# Patient Record
Sex: Male | Born: 1965 | Race: White | Hispanic: No | Marital: Married | Smoking: Never smoker
Health system: Southern US, Community
[De-identification: ages and names within clinical notes are randomized; demographics above are authoritative.]

## PROBLEM LIST (undated history)

## (undated) DIAGNOSIS — M545 Low back pain, unspecified: Secondary | ICD-10-CM

## (undated) DIAGNOSIS — D696 Thrombocytopenia, unspecified: Secondary | ICD-10-CM

## (undated) DIAGNOSIS — B019 Varicella without complication: Secondary | ICD-10-CM

## (undated) DIAGNOSIS — G8929 Other chronic pain: Secondary | ICD-10-CM

## (undated) DIAGNOSIS — D72819 Decreased white blood cell count, unspecified: Secondary | ICD-10-CM

## (undated) HISTORY — DX: Varicella without complication: B01.9

---
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
## 2015-10-16 ENCOUNTER — Ambulatory Visit (HOSPITAL_COMMUNITY)
Admission: EM | Admit: 2015-10-16 | Discharge: 2015-10-16 | Disposition: A | Discharge status: Home / Self Care | Payer: BLUE CROSS/BLUE SHIELD | Attending: Emergency Medicine | Admitting: Emergency Medicine

## 2015-10-16 ENCOUNTER — Encounter (HOSPITAL_COMMUNITY): Payer: Self-pay | Admitting: *Deleted

## 2015-10-16 DIAGNOSIS — S39012A Strain of muscle, fascia and tendon of lower back, initial encounter: Secondary | ICD-10-CM

## 2015-10-16 DIAGNOSIS — M545 Low back pain, unspecified: Secondary | ICD-10-CM

## 2015-10-16 HISTORY — DX: Low back pain: M54.5

## 2015-10-16 HISTORY — DX: Low back pain, unspecified: M54.50

## 2015-10-16 HISTORY — DX: Other chronic pain: G89.29

## 2015-10-16 HISTORY — DX: Thrombocytopenia, unspecified: D69.6

## 2015-10-16 HISTORY — DX: Decreased white blood cell count, unspecified: D72.819

## 2015-10-16 MED ORDER — KETOROLAC TROMETHAMINE 60 MG/2ML IM SOLN
INTRAMUSCULAR | Status: AC
  Filled 2015-10-16: qty 2

## 2015-10-16 MED ORDER — HYDROCODONE-ACETAMINOPHEN 5-325 MG PO TABS
1.0000 | ORAL_TABLET | Freq: Once | ORAL | Status: AC
  Administered 2015-10-16: 1 via ORAL

## 2015-10-16 MED ORDER — HYDROCODONE-ACETAMINOPHEN 5-325 MG PO TABS
1.0000 | ORAL_TABLET | ORAL | Status: DC | PRN
Start: 2015-10-16 — End: 2016-02-28

## 2015-10-16 MED ORDER — PREDNISONE 20 MG PO TABS: ORAL_TABLET | ORAL | Status: DC

## 2015-10-16 MED ORDER — KETOROLAC TROMETHAMINE 60 MG/2ML IM SOLN
60.0000 mg | Freq: Once | INTRAMUSCULAR | Status: AC
  Administered 2015-10-16: 60 mg via INTRAMUSCULAR

## 2015-10-16 MED ORDER — DICLOFENAC SODIUM 75 MG PO TBEC: 75.0000 mg | DELAYED_RELEASE_TABLET | Freq: Two times a day (BID) | ORAL | Status: DC

## 2015-10-16 MED ORDER — HYDROCODONE-ACETAMINOPHEN 5-325 MG PO TABS
ORAL_TABLET | ORAL | Status: AC
  Filled 2015-10-16: qty 1

## 2015-10-16 MED ORDER — METAXALONE 800 MG PO TABS: 800.0000 mg | ORAL_TABLET | Freq: Three times a day (TID) | ORAL | Status: DC

## 2015-10-16 NOTE — ED Provider Notes (Signed)
HPI  SUBJECTIVE:  Casey Guerra is a 50 y.o. male who presents with Constant, tight, stabbing right low back pain described as spasm starting this morning. Patient has a  past medical history of chronic low back pain, has not had a flare in "years". went [facility] yesterday, states that he was sore afterwards, but woke up this morning and a full blown flare. The pain does not radiate.He tried diclofenac 75 mg of Flexeril this morning. Symptoms are better with stretching and bending forward, worse with sitting and engaging his cord and with any movement. He has also tried a Restaurant manager, fast food and yoga in the past. Denies N/V, fevers, flank pain, abdominal pain, urinary urgency, frequency, dysuria, cloudy or odorous urine, hematuria.  N. No saddle anesthesia, distal weakness/numbness, bilateral radicular leg pain/weakness,  recent h/o trauma, neurological deficits,  bladder/ bowel incontinence, h/o CA / multiple myleoma, unexplained weight loss, pain worse at night,  h/o prolonged steroid use, h/o osteopenia, h/o IVDU, h/o HIV, known AAA.  States feels identical to previous episodes of back pain. no h/o pyelonephritis, nephrolithiasis.   Past Medical History  Diagnosis Date  . Chronic low back pain   . Thrombocytopenia ([place])   . Leukopenia     History reviewed. No pertinent past surgical history.  No family history on file.  Social History  Substance Use Topics  . Smoking status: Never Smoker   . Smokeless tobacco: None  . Alcohol Use: Yes     Comment: occasionally    No current facility-administered medications for this encounter.  Current outpatient prescriptions:  .  diclofenac (VOLTAREN) 75 MG EC tablet, Take 1 tablet (75 mg total) by mouth 2 (two) times daily., Disp: 40 tablet, Rfl: 0 .  HYDROcodone-acetaminophen (NORCO/VICODIN) 5-325 MG tablet, Take 1-2 tablets by mouth every 4 (four) hours as needed for moderate pain., Disp: 20 tablet, Rfl: 0 .  metaxalone (SKELAXIN) 800 MG tablet,  Take 1 tablet (800 mg total) by mouth 3 (three) times daily., Disp: 21 tablet, Rfl: 0 .  predniSONE (DELTASONE) 20 MG tablet, Take 3 tabs po on first day, 2 tabs second day, 2 tabs third day, 1 tab fourth day, 1 tab 5th day. Take with food., Disp: 9 tablet, Rfl: 0  Allergies  Allergen Reactions  . Codeine Anaphylaxis    Tolerates hydrocodone without any difficulty     ROS  As noted in HPI.   Physical Exam  BP 116/82 mmHg  Pulse 67  Temp(Src) 97.6 F (36.4 C) (Oral)  Resp 16  SpO2 99%  Constitutional: Well developed, well nourished, Moderate painful distress Eyes:  EOMI, conjunctiva normal bilaterally HENT: Normocephalic, atraumatic,mucus membranes moist Respiratory: Normal inspiratory effort Cardiovascular: Normal rate GI: nondistended. No suprapubic tenderness skin: No rash, skin intact Musculoskeletal: no CVAT. + paralumbar tenderness, +  muscle spasm. No bony tenderness. Bilateral lower extremities nontender, baseline ROM with intact PT pulses, No pain with int/ext rotation flex/extension hips bilaterally. SLR neg bilaterally. Sensation baseline light touch bilaterally for Pt, DTR's symmetric and intact bilaterally KJ , Pain aggravated with right hip flexion against resistance. Motor symmetric bilateral 5/5 hip flexion, quadriceps, hamstrings, EHL, foot dorsiflexion, foot plantarflexion, gait somewhat antalgic but without apparent new ataxia. Neurologic: Alert & oriented x 3, no focal neuro deficits Psychiatric: Speech and behavior appropriate   ED Course   Medications  ketorolac (TORADOL) injection 60 mg (60 mg Intramuscular Given 10/16/15 1907)  HYDROcodone-acetaminophen (NORCO/VICODIN) 5-325 MG per tablet 1 tablet (1 tablet Oral Given 10/16/15 1907)  Orders Placed This Encounter  Procedures  . Ambulatory referral to Physical Therapy    Referral Priority:  Urgent    Referral Type:  Physical Medicine    Referral Reason:  Specialty Services Required    Requested  Specialty:  Physical Therapy    Number of Visits Requested:  1    No results found for this or any previous visit (from the past 24 hour(s)). No results found.  ED Clinical Impression  Lumbar strain, initial encounter  Right-sided low back pain without sciatica   ED Assessment/Plan  [place] narcotic database reviewed. Pt with no narcotic rx In the past 6 months.   No evidence of uti, nephrolithiasis.  No evidence of spinal cord involvement based on H&P. Pt describing typical back pain, has been < 6 week duration. No historical red flags as noted in HPI. No physical red flags such as fever, bony tenderness, lower extremity weakness, saddle anesthesia. Imaging not indicated at this time.    Patient given Toradol and Norco 1. Home with NSAID, APAP/narcotic, muscle relaxants, steriod. Pt to f/u with PT and PMD of choice.  Discussed labs, medical decision-making, and plan for follow-up with the patient.  Discussed signs and symptoms that should prompt return to the emergency department.  Patient agrees with plan.  *This clinic note was created using Dragon dictation software. Therefore, there may be occasional mistakes despite careful proofreading.  ?    Melynda Ripple, MD 10/16/15 [phone]

## 2015-10-16 NOTE — Discharge Instructions (Signed)
Take the NSAID on a regular basis for the next 10 days. Norco for severe pain only. many people find gentle stretching and deep tissue massage helpful. Try [facility] on [address]. They have very reasonable prices and take walk ins. Or you can go to  [facility]. Follow-up with your primary care physician or a Primary care physician of your choice- see list below- in several days, go to the ER for the signs and symptoms we discussed.  [place]: [place]  [phone]  [place] and Urgent [facility]: [place]   [phone]  [facility]: [address] [place]  [phone]  [facility] : [address] [phone]  [facility]: [address] [place] [phone]  [facility]: [place] [phone]  Dr. Blanchie Serve [place]  [phone]  Dr. Benito Mccreedy, [facility]. [place], [place] [address]  [phone]

## 2015-10-16 NOTE — ED Notes (Signed)
Crackers provided.

## 2015-10-16 NOTE — ED Notes (Addendum)
Hx low back pain that has been managed well with exercise and yoga for several years; last yr suffered a shoulder injury with worker's comp, was unable to do his regular exercise of mountain biking.  Yesterday went mountain biking; woke up this AM with non-radiating severe low back pain.  Has tried stretching, cyclobenzaprine and diclofenac without any relief at all.  Pt hunched over.  Denies any parasthesias.  Pt recently had routine blood work done for physical last week - was told he had thrombocytopenia and leukopenia; had repeat CBC done 5 days ago - has not heard results yet.

## 2015-10-17 DIAGNOSIS — C4492 Squamous cell carcinoma of skin, unspecified: Secondary | ICD-10-CM

## 2015-10-17 HISTORY — DX: Squamous cell carcinoma of skin, unspecified: C44.92

## 2016-02-28 ENCOUNTER — Encounter: Payer: Self-pay | Admitting: Family Medicine

## 2016-02-28 ENCOUNTER — Ambulatory Visit (INDEPENDENT_AMBULATORY_CARE_PROVIDER_SITE_OTHER): Payer: BLUE CROSS/BLUE SHIELD | Admitting: Family Medicine

## 2016-02-28 ENCOUNTER — Encounter: Payer: Self-pay | Admitting: Sports Medicine

## 2016-02-28 DIAGNOSIS — M25561 Pain in right knee: Secondary | ICD-10-CM | POA: Diagnosis not present

## 2016-02-28 MED ORDER — NITROGLYCERIN 0.2 MG/HR TD PT24: MEDICATED_PATCH | TRANSDERMAL | 1 refills | Status: DC

## 2016-02-28 NOTE — Patient Instructions (Signed)
Nitroglycerin Protocol   Apply 1/4 nitroglycerin patch to affected area daily.  Change position of patch within the affected area every 24 hours.  You may experience a headache during the first 1-2 weeks of using the patch, these should subside.  If you experience headaches after beginning nitroglycerin patch treatment, you may take your preferred over the counter pain reliever.  Another side effect of the nitroglycerin patch is skin irritation or rash related to patch adhesive.  Please notify our office if you develop more severe headaches or rash, and stop the patch.  Tendon healing with nitroglycerin patch may require 12 to 24 weeks depending on the extent of injury.  Men should not use if taking Viagra, Cialis, or Levitra.   Do not use if you have migraines or rosacea.    FOLLOW UP IN 4 WEEKS

## 2016-02-28 NOTE — Progress Notes (Signed)
  [name] - 50 y.o. male MRN [id number]  Date of birth: 1965/08/18  SUBJECTIVE:  Including CC & ROS.  Chief Complaint  Patient presents with  . Knee Pain     Casey Guerra is a 50 yo M that is presenting with right knee pain. This pain is acute on chronic and started at the beginning of this past year. 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 He has been getting over back and shoulder problems recently. The pain is occurring on this. Pole of his patella. He has pain with the downward dog and yoga. Describes pain as a dull ache and seems be getting worse. The pain is localized. He denies any locking, swelling or giving way. He denies any prior injury to his knee.  ROS: No unexpected weight loss, fever, chills, swelling, instability, muscle pain, numbness/tingling, redness, otherwise see HPI    HISTORY: Past Medical, Surgical, Social, and Family History Reviewed & Updated per EMR.   Pertinent Historical Findings include: PMSHx - back pain   PSHx -  No tobacco use. Occasional alcohol use. [place]  FHx -  OA Medications - voltaren   DATA REVIEWED: None to review   PHYSICAL EXAM:  VS: BP:126/69  HR:60bpm  TEMP: ( )  RESP:   HT:5\' 9"  (175.3 cm)   WT:172 lb (78 kg)  BMI:25.5 PHYSICAL EXAM: Gen: NAD, alert, cooperative with exam, well-appearing HEENT: clear conjunctiva, EOMI CV:  no edema, capillary refill brisk,  Resp: non-labored, normal speech Skin: no rashes, normal turgor  Neuro: no gross deficits.  Psych:  alert and oriented Right Knee: Normal to inspection with no erythema or effusion or obvious bony abnormalities. Some pain to palpation on the superior pole of the patella. ROM full in flexion and extension and lower leg rotation. Normal strength in the lower extremity. Ligaments with solid consistent endpoints including ACL, LCL, MCL. Negative Mcmurray's Hamstring and quadriceps strength is normal.  Neurovascularly  intact  Limited US: Right Knee: No effusion noted in the suprapatellar pouch. Quadricep tendon was normal appearance. There is spurring upon the insertion of the quadriceps tendon into the patella. Normal patellar tendon. Normal medial and lateral meniscus.  ASSESSMENT & PLAN:   Right knee pain Pain is most likely related to the spurring observed on [place] on the insertion of the quad tendon. No effusion to suggest intra-articular pathology.  - try nitro patch  - provided home exercises  - will follow up in 4 weeks to re-scan and monitor for improvement. May need to consider PT if no improvement.

## 2016-02-29 DIAGNOSIS — M25561 Pain in right knee: Secondary | ICD-10-CM | POA: Insufficient documentation

## 2016-02-29 NOTE — Assessment & Plan Note (Signed)
Pain is most likely related to the spurring observed on [place] on the insertion of the quad tendon. No effusion to suggest intra-articular pathology.  - try nitro patch  - provided home exercises  - will follow up in 4 weeks to re-scan and monitor for improvement. May need to consider PT if no improvement.

## 2016-03-27 ENCOUNTER — Encounter: Payer: Self-pay | Admitting: Sports Medicine

## 2016-03-27 ENCOUNTER — Encounter: Payer: Self-pay | Admitting: Family Medicine

## 2016-03-27 ENCOUNTER — Ambulatory Visit (INDEPENDENT_AMBULATORY_CARE_PROVIDER_SITE_OTHER): Payer: BLUE CROSS/BLUE SHIELD | Admitting: Family Medicine

## 2016-03-27 DIAGNOSIS — M25561 Pain in right knee: Secondary | ICD-10-CM

## 2016-03-27 NOTE — Assessment & Plan Note (Signed)
He was not able to tolerate the nitroglycerin patches but he said an improvement of his pain with the use of different oils. - Advised to continue using oils on a regular basis - Advised to follow-up as needed. - May try compression sleeve if his pain re-occurs

## 2016-03-27 NOTE — Progress Notes (Signed)
  Casey Guerra - 50 y.o. male MRN [id number]  Date of birth: April 07, 1966  SUBJECTIVE:  Including CC & ROS.  Chief Complaint  Patient presents with  . Knee Pain   Casey Guerra is a 50 year old mellitus following up for his right knee pain. He was started on nitroglycerin patches but was not able to tolerate them. He developed a severe headache and subsequent emesis. He started using Cypress oil and coconut oil about 4-5 times daily. He reports a 70% reduction of his pain. He hasn't been riding his bike yet but has been walking. He is able walk 4 miles the other day with no pain.   ROS: No unexpected weight loss, fever, chills, swelling, instability, muscle pain, numbness/tingling, redness, otherwise see HPI   HISTORY: Past Medical, Surgical, Social, and Family History Reviewed & Updated per EMR.   Pertinent Historical Findings include: PMSHx -  Back pain   DATA REVIEWED: Previous ultrasound scans.   PHYSICAL EXAM:  VS: BP:109/78  HR: bpm  TEMP: ( )  RESP:   HT:5\' 9"  (175.3 cm)   WT:172 lb (78 kg)  BMI:25.5 PHYSICAL EXAM: Gen: NAD, alert, cooperative with exam, well-appearing HEENT: clear conjunctiva, EOMI CV:  no edema, capillary refill brisk,  Resp: non-labored, normal speech Skin: no rashes, normal turgor  Neuro: no gross deficits.  Psych:  alert and oriented Knee:  No tenderness to palpation of the quarter patellar tendon. No obvious effusion. No tenderness to palpation of the medial lateral joint line. Normal range of motion. 5 out of 5 strength.  Limited ultrasound: Right knee: No effusion in the suprapatellar pouch. Quadriceps tendon was reviewed along the short axis and normal. The insertion into the patella showed continued spurs that was noticed on the last exam.  ASSESSMENT & PLAN:   Right knee pain He was not able to tolerate the nitroglycerin patches but he said an improvement of his pain with the use of different oils. - Advised to continue using oils on a  regular basis - Advised to follow-up as needed. - May try compression sleeve if his pain re-occurs

## 2017-02-27 ENCOUNTER — Encounter: Payer: Self-pay | Admitting: Family Medicine

## 2017-02-27 ENCOUNTER — Ambulatory Visit (INDEPENDENT_AMBULATORY_CARE_PROVIDER_SITE_OTHER): Payer: BLUE CROSS/BLUE SHIELD | Admitting: Family Medicine

## 2017-02-27 VITALS — BP 120/70 | HR 72 | Ht 69.0 in | Wt 164.2 lb

## 2017-02-27 DIAGNOSIS — M778 Other enthesopathies, not elsewhere classified: Secondary | ICD-10-CM | POA: Diagnosis not present

## 2017-02-27 DIAGNOSIS — Z0000 Encounter for general adult medical examination without abnormal findings: Secondary | ICD-10-CM | POA: Diagnosis not present

## 2017-02-27 NOTE — Progress Notes (Signed)
Casey Guerra is a 51 y.o. male is here to [facility].   Patient Care Team: Briscoe Deutscher, DO as PCP - General (Family Medicine)   History of Present Illness:   Casey Guerra, cma is acting as a Education administrator for [employer], DO.  HPI: Patient is here to establish care and for a physical. He does complain of some mild right elbow pain that started a few months ago when he starting playing disc golf. It only hurts in the morning and not impairing function.  Health Maintenance Due  Topic Date Due  . HIV Screening  03/15/1983  . TETANUS/TDAP  03/15/1987   Depression screen PHQ 2/9 02/27/2017  Decreased Interest 0  Down, Depressed, Hopeless 0  PHQ - 2 Score 0   PMHx, SurgHx, SocialHx, Medications, and Allergies were reviewed in the Visit Navigator and updated as appropriate.   Past Medical History:  Diagnosis Date  . Chicken pox   . Chronic low back pain   . Leukopenia   . Thrombocytopenia ([place])     Past Surgical History:  Procedure Laterality Date  . Wart Removed From Tongue  1974    Family History  Problem Relation Age of Onset  . Arthritis Mother   . Cancer Mother   . Depression Mother   . Hearing loss Mother   . Hypertension Mother   . Alcohol abuse Father   . Cancer Father   . Depression Brother   . Drug abuse Brother   . Learning disabilities Son    Social History  Substance Use Topics  . Smoking status: Never Smoker  . Smokeless tobacco: Never Used  . Alcohol use Yes     Comment: occasionally   Current Medications and Allergies:   No current outpatient prescriptions on file.  Allergies  Allergen Reactions  . Codeine Anaphylaxis    Tolerates hydrocodone without any difficulty   Review of Systems:   Pertinent items are noted in the HPI. Otherwise, ROS is negative.  Vitals:   Vitals:   02/27/17 1520  BP: 120/70  Pulse: 72  SpO2: 97%  Weight: 164 lb 3.2 oz (74.5 kg)  Height: 5' 9"  (1.753 m)     Body mass index is 24.25 kg/m. Physical  Exam:   Physical Exam  Constitutional: He is oriented to person, place, and time. He appears well-developed and well-nourished. No distress.  HENT:  Head: Normocephalic and atraumatic.  Right Ear: External ear normal.  Left Ear: External ear normal.  Nose: Nose normal.  Mouth/Throat: Oropharynx is clear and moist.  Eyes: Pupils are equal, round, and reactive to light. Conjunctivae and EOM are normal.  Neck: Normal range of motion. Neck supple.  Cardiovascular: Normal rate, regular rhythm, normal heart sounds and intact distal pulses.   Pulmonary/Chest: Effort normal and breath sounds normal.  Abdominal: Soft. Bowel sounds are normal.  Musculoskeletal: Normal range of motion.  Neurological: He is alert and oriented to person, place, and time.  Skin: Skin is warm and dry.  Psychiatric: He has a normal mood and affect. His behavior is normal. Judgment and thought content normal.  Nursing note and vitals reviewed.  Results for orders placed or performed in visit on 02/27/17  Comp Met (CMET)  Result Value Ref Range   Sodium 139 135 - 145 mEq/L   Potassium 3.9 3.5 - 5.1 mEq/L   Chloride 103 96 - 112 mEq/L   CO2 28 19 - 32 mEq/L   Glucose, Bld 95 70 - 99 mg/dL  BUN 27 (H) 6 - 23 mg/dL   Creatinine, Ser 0.97 0.40 - 1.50 mg/dL   Total Bilirubin 0.3 0.2 - 1.2 mg/dL   Alkaline Phosphatase 51 39 - 117 U/L   AST 19 0 - 37 U/L   ALT 15 0 - 53 U/L   Total Protein 6.5 6.0 - 8.3 g/dL   Albumin 4.4 3.5 - 5.2 g/dL   Calcium 9.3 8.4 - 10.5 mg/dL   GFR 87.45 >60.00 mL/min  Lipid panel  Result Value Ref Range   Cholesterol 181 0 - 200 mg/dL   Triglycerides 175.0 (H) 0.0 - 149.0 mg/dL   HDL 60.60 >39.00 mg/dL   VLDL 35.0 0.0 - 40.0 mg/dL   LDL Cholesterol 85 0 - 99 mg/dL   Total CHOL/HDL Ratio 3    NonHDL 119.97    Assessment and Plan:   Casey Guerra was seen today for establish care and right elbow pain.  Diagnoses and all orders for this visit:  Routine physical examination -     Comp Met  (CMET) -     Lipid panel  Right elbow tendonitis Comments: Mild. PRICE discussed. If worse, to [place].   . Reviewed expectations re: course of current medical issues. . Discussed self-management of symptoms. . Outlined signs and symptoms indicating need for more acute intervention. . Patient verbalized understanding and all questions were answered. Marland Kitchen Health Maintenance issues including appropriate healthy diet, exercise, and smoking avoidance were discussed with patient. . See orders for this visit as documented in the electronic medical record. . Patient received an After Visit Summary.  CMA served as Education administrator during this visit. History, Physical, and Plan performed by medical provider. The above documentation has been reviewed and is accurate and complete. Briscoe Deutscher, D.O.  Briscoe Deutscher, DO [place] [place] [facility] 03/09/2017  No future appointments.

## 2017-02-28 LAB — COMPREHENSIVE METABOLIC PANEL
ALT: 15 U/L (ref 0–53)
AST: 19 U/L (ref 0–37)
Albumin: 4.4 g/dL (ref 3.5–5.2)
Alkaline Phosphatase: 51 U/L (ref 39–117)
BUN: 27 mg/dL — ABNORMAL HIGH (ref 6–23)
CO2: 28 mEq/L (ref 19–32)
Calcium: 9.3 mg/dL (ref 8.4–10.5)
Chloride: 103 mEq/L (ref 96–112)
Creatinine, Ser: 0.97 mg/dL (ref 0.40–1.50)
GFR: 87.45 mL/min (ref >60.00)
Glucose, Bld: 95 mg/dL (ref 70–99)
Potassium: 3.9 mEq/L (ref 3.5–5.1)
Sodium: 139 mEq/L (ref 135–145)
Total Bilirubin: 0.3 mg/dL (ref 0.2–1.2)
Total Protein: 6.5 g/dL (ref 6.0–8.3)

## 2017-02-28 LAB — LIPID PANEL
Cholesterol: 181 mg/dL (ref 0–200)
HDL: 60.6 mg/dL (ref >39.00)
LDL Cholesterol: 85 mg/dL (ref 0–99)
NonHDL: 119.97
Total CHOL/HDL Ratio: 3
Triglycerides: 175 mg/dL — ABNORMAL HIGH (ref 0.0–149.0)
VLDL: 35 mg/dL (ref 0.0–40.0)

## 2017-03-04 ENCOUNTER — Telehealth: Payer: Self-pay | Admitting: Family Medicine

## 2017-03-04 NOTE — Telephone Encounter (Signed)
ROI faxed to Dr. Sheryn Bison

## 2017-07-05 ENCOUNTER — Ambulatory Visit: Payer: BLUE CROSS/BLUE SHIELD | Admitting: Family Medicine

## 2017-07-05 ENCOUNTER — Encounter: Payer: Self-pay | Admitting: Family Medicine

## 2017-07-05 ENCOUNTER — Encounter: Payer: Self-pay | Admitting: Surgical

## 2017-07-05 VITALS — BP 112/68 | HR 67 | Temp 98.1°F | Ht 69.0 in | Wt 167.0 lb

## 2017-07-05 DIAGNOSIS — H9313 Tinnitus, bilateral: Secondary | ICD-10-CM

## 2017-07-05 LAB — COMPREHENSIVE METABOLIC PANEL
ALT: 15 U/L (ref 0–53)
AST: 20 U/L (ref 0–37)
Albumin: 4.3 g/dL (ref 3.5–5.2)
Alkaline Phosphatase: 48 U/L (ref 39–117)
BUN: 11 mg/dL (ref 6–23)
CO2: 29 mEq/L (ref 19–32)
Calcium: 9.1 mg/dL (ref 8.4–10.5)
Chloride: 106 mEq/L (ref 96–112)
Creatinine, Ser: 0.96 mg/dL (ref 0.40–1.50)
GFR: 88.37 mL/min (ref >60.00)
Glucose, Bld: 105 mg/dL — ABNORMAL HIGH (ref 70–99)
Potassium: 4.3 mEq/L (ref 3.5–5.1)
Sodium: 141 mEq/L (ref 135–145)
Total Bilirubin: 0.4 mg/dL (ref 0.2–1.2)
Total Protein: 6.9 g/dL (ref 6.0–8.3)

## 2017-07-05 LAB — CBC WITH DIFFERENTIAL/PLATELET
Basophils Absolute: 0 10*3/uL (ref 0.0–0.1)
Basophils Relative: 0.4 % (ref 0.0–3.0)
Eosinophils Absolute: 0.1 10*3/uL (ref 0.0–0.7)
Eosinophils Relative: 2.8 % (ref 0.0–5.0)
HCT: 43.3 % (ref 39.0–52.0)
Hemoglobin: 14.4 g/dL (ref 13.0–17.0)
Lymphocytes Relative: 32.8 % (ref 12.0–46.0)
Lymphs Abs: 1.2 10*3/uL (ref 0.7–4.0)
MCHC: 33.3 g/dL (ref 30.0–36.0)
MCV: 93.4 fl (ref 78.0–100.0)
Monocytes Absolute: 0.4 10*3/uL (ref 0.1–1.0)
Monocytes Relative: 11.2 % (ref 3.0–12.0)
Neutro Abs: 2 10*3/uL (ref 1.4–7.7)
Neutrophils Relative %: 52.8 % (ref 43.0–77.0)
Platelets: 148 10*3/uL — ABNORMAL LOW (ref 150.0–400.0)
RBC: 4.63 Mil/uL (ref 4.22–5.81)
RDW: 13 % (ref 11.5–15.5)
WBC: 3.7 10*3/uL — ABNORMAL LOW (ref 4.0–10.5)

## 2017-07-05 LAB — TSH: TSH: 2.42 u[IU]/mL (ref 0.35–4.50)

## 2017-07-05 NOTE — Progress Notes (Signed)
Casey Guerra is a 52 y.o. male is here for follow up.  History of Present Illness:   HPI: See Assessment and Plan section for Problem Based Charting of issues discussed today.  Health Maintenance Due  Topic Date Due  . HIV Screening  03/15/1983  . TETANUS/TDAP  03/15/1987   Depression screen PHQ 2/9 02/27/2017 03/27/2016 02/28/2016  Decreased Interest 0 0 0  Down, Depressed, Hopeless 0 0 0  PHQ - 2 Score 0 0 0   PMHx, SurgHx, SocialHx, FamHx, Medications, and Allergies were reviewed in the Visit Navigator and updated as appropriate.   Patient Active Problem List   Diagnosis Date Noted  . Right knee pain 02/29/2016   Social History   Tobacco Use  . Smoking status: Never Smoker  . Smokeless tobacco: Never Used  Substance Use Topics  . Alcohol use: Yes    Comment: occasionally  . Drug use: Yes    Types: Other-see comments    Comment: Marijuana   Current Medications and Allergies:  No current outpatient medications on file.   Allergies  Allergen Reactions  . Codeine Anaphylaxis    Tolerates hydrocodone without any difficulty   Review of Systems   Pertinent items are noted in the HPI. Otherwise, ROS is negative.  Vitals:   Vitals:   07/05/17 0727  BP: 112/68  Pulse: 67  Temp: 98.1 F (36.7 C)  TempSrc: Oral  SpO2: 96%  Weight: 167 lb (75.8 kg)  Height: 5\' 9"  (1.753 m)     Body mass index is 24.66 kg/m.   Physical Exam:   Physical Exam  Constitutional: He is oriented to person, place, and time. He appears well-developed and well-nourished. No distress.  HENT:  Head: Normocephalic and atraumatic.  Right Ear: External ear normal.  Left Ear: External ear normal.  Nose: Nose normal.  Mouth/Throat: Oropharynx is clear and moist.  Eyes: Conjunctivae and EOM are normal. Pupils are equal, round, and reactive to light.  Neck: Normal range of motion. Neck supple.  Cardiovascular: Normal rate, regular rhythm, normal heart sounds and intact distal pulses.    Pulmonary/Chest: Effort normal and breath sounds normal.  Abdominal: Soft. Bowel sounds are normal.  Musculoskeletal: Normal range of motion.  Neurological: He is alert and oriented to person, place, and time.  Skin: Skin is warm and dry.  Psychiatric: He has a normal mood and affect. His behavior is normal. Judgment and thought content normal.  Nursing note and vitals reviewed.   Assessment and Plan:   Diagnoses and all orders for this visit:  Tinnitus of both ears Comments: Patient presents with tinnitus. Onset of symptoms was abrupt starting 3 weeks ago ago with unchanged course since that time. Patient describes the tinnitus as constant located in the bilateral ear. The quality is described as high pitch that sounds like seashells. The pattern is nonpulsatile with an intensity that is medium. Patient describes his level of annoyance as minimally annoying, intermittently aware. Associated symptoms include: no hearing loss, pain, dizziness, drainage or recurrent otitis. Family history is negative family history for tinnitus Patient has had no prior evaluation, treatment or surgery for tinnitus Patient does not have hearing aids at this time. Previous treatments include none.  Hearing screening today: WNL. ENT referral in place.   Orders: -     Comprehensive metabolic panel -     CBC with Differential/Platelet -     TSH  . Reviewed expectations re: course of current medical issues. . Discussed self-management of symptoms. Marland Kitchen  Outlined signs and symptoms indicating need for more acute intervention. . Patient verbalized understanding and all questions were answered. Marland Kitchen Health Maintenance issues including appropriate healthy diet, exercise, and smoking avoidance were discussed with patient. . See orders for this visit as documented in the electronic medical record. . Patient received an After Visit Summary.  Briscoe Deutscher, DO [place] 07/05/2017  No future  appointments.

## 2017-07-23 ENCOUNTER — Telehealth: Payer: Self-pay

## 2017-07-23 DIAGNOSIS — R5383 Other fatigue: Secondary | ICD-10-CM

## 2017-07-23 NOTE — Telephone Encounter (Signed)
Pt coming for repeat labs 07/25/17. Please place future orders. Thank you.

## 2017-07-23 NOTE — Telephone Encounter (Signed)
Labs ordered.

## 2017-07-25 ENCOUNTER — Encounter: Payer: Self-pay | Admitting: *Deleted

## 2017-07-25 ENCOUNTER — Other Ambulatory Visit (INDEPENDENT_AMBULATORY_CARE_PROVIDER_SITE_OTHER): Payer: BLUE CROSS/BLUE SHIELD

## 2017-07-25 DIAGNOSIS — R5383 Other fatigue: Secondary | ICD-10-CM

## 2017-07-26 ENCOUNTER — Other Ambulatory Visit: Payer: BLUE CROSS/BLUE SHIELD

## 2017-07-26 DIAGNOSIS — R5383 Other fatigue: Secondary | ICD-10-CM

## 2017-07-26 LAB — CBC WITH DIFFERENTIAL/PLATELET
Basophils Absolute: 9 cells/uL (ref 0–200)
Basophils Relative: 0.2 %
Eosinophils Absolute: 81 cells/uL (ref 15–500)
Eosinophils Relative: 1.8 %
HCT: 37.9 % — ABNORMAL LOW (ref 38.5–50.0)
Hemoglobin: 13.1 g/dL — ABNORMAL LOW (ref 13.2–17.1)
Lymphs Abs: 1224 cells/uL (ref 850–3900)
MCH: 31 pg (ref 27.0–33.0)
MCHC: 34.6 g/dL (ref 32.0–36.0)
MCV: 89.8 fL (ref 80.0–100.0)
MPV: 12.4 fL (ref 7.5–12.5)
Monocytes Relative: 10 %
Neutro Abs: 2736 cells/uL (ref 1500–7800)
Neutrophils Relative %: 60.8 %
Platelets: 137 10*3/uL — ABNORMAL LOW (ref 140–400)
RBC: 4.22 10*6/uL (ref 4.20–5.80)
RDW: 12.6 % (ref 11.0–15.0)
Total Lymphocyte: 27.2 %
WBC mixed population: 450 cells/uL (ref 200–950)
WBC: 4.5 10*3/uL (ref 3.8–10.8)

## 2017-12-10 ENCOUNTER — Encounter: Payer: Self-pay | Admitting: *Deleted

## 2017-12-10 ENCOUNTER — Encounter: Payer: Self-pay | Admitting: Family Medicine

## 2017-12-10 ENCOUNTER — Ambulatory Visit: Payer: BLUE CROSS/BLUE SHIELD | Admitting: Family Medicine

## 2017-12-10 ENCOUNTER — Ambulatory Visit (INDEPENDENT_AMBULATORY_CARE_PROVIDER_SITE_OTHER): Payer: BLUE CROSS/BLUE SHIELD

## 2017-12-10 VITALS — BP 120/62 | HR 60 | Temp 98.6°F | Ht 64.0 in | Wt 178.6 lb

## 2017-12-10 DIAGNOSIS — E785 Hyperlipidemia, unspecified: Secondary | ICD-10-CM | POA: Insufficient documentation

## 2017-12-10 DIAGNOSIS — Z23 Encounter for immunization: Secondary | ICD-10-CM

## 2017-12-10 DIAGNOSIS — M25562 Pain in left knee: Secondary | ICD-10-CM
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
## 2017-12-13 ENCOUNTER — Ambulatory Visit: Payer: Self-pay

## 2017-12-13 ENCOUNTER — Ambulatory Visit: Payer: BLUE CROSS/BLUE SHIELD | Admitting: Sports Medicine

## 2017-12-13 ENCOUNTER — Encounter: Payer: Self-pay | Admitting: Sports Medicine

## 2017-12-13 VITALS — BP 102/72 | HR 70 | Ht 69.0 in | Wt 179.0 lb

## 2017-12-13 DIAGNOSIS — M25562 Pain in left knee: Secondary | ICD-10-CM | POA: Diagnosis not present

## 2017-12-13 DIAGNOSIS — G8929 Other chronic pain: Secondary | ICD-10-CM | POA: Diagnosis not present
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
## 2017-12-13 NOTE — Progress Notes (Signed)
PROCEDURE NOTE:  Ultrasound Guided: Injection: Left knee Images were obtained and interpreted by myself, Teresa Coombs, DO  Images have been saved and stored to PACS system. Images obtained on: GE S7 Ultrasound machine    ULTRASOUND FINDINGS:  Left knee has a moderate degree of synovitis. There is a small effusion which is supraphysiologic. Small amount of swelling just deep to the IT band.   DESCRIPTION OF PROCEDURE:  The patient's clinical condition is marked by substantial pain and/or significant functional disability. Other conservative therapy has not provided relief, is contraindicated, or not appropriate. There is a reasonable likelihood that injection will significantly improve the patient's pain and/or functional impairment.   After discussing the risks, benefits and expected outcomes of the injection and all questions were reviewed and answered, the patient wished to undergo the above named procedure.  Verbal consent was obtained.  The ultrasound was used to identify the target structure and adjacent neurovascular structures. The skin was then prepped in sterile fashion and the target structure was injected under direct visualization using sterile technique as below:  Single injection performed as below: PREP: Alcohol and Ethel Chloride APPROACH:superiolateral, single injection, 25g 1.5 in. INJECTATE: 2 cc 0.5% Marcaine and 2 cc 40mg /mL DepoMedrol ASPIRATE: None DRESSING: Band-Aid  Post procedural instructions including recommending icing and warning signs for infection were reviewed.    This procedure was well tolerated and there were no complications.   IMPRESSION: Succesful Ultrasound Guided: Injection

## 2017-12-13 NOTE — Patient Instructions (Addendum)

## 2018-01-07 ENCOUNTER — Encounter: Payer: Self-pay | Admitting: Family Medicine

## 2018-01-07 ENCOUNTER — Ambulatory Visit: Payer: BLUE CROSS/BLUE SHIELD | Admitting: Family Medicine

## 2018-01-07 VITALS — BP 126/82 | HR 71 | Temp 97.9°F | Ht 69.0 in | Wt 181.8 lb

## 2018-01-07 DIAGNOSIS — M79671 Pain in right foot: Secondary | ICD-10-CM

## 2018-01-07 MED ORDER — DICLOFENAC SODIUM 75 MG PO TBEC: 75.0000 mg | DELAYED_RELEASE_TABLET | Freq: Two times a day (BID) | ORAL | 0 refills | Status: AC

## 2018-01-07 NOTE — Patient Instructions (Signed)
It was very nice to see you today!  I think you have irritated your plantar fascia.  Please take the anti-inflammatories we discussed.  Please work on exercises.  Come back to see Dr. Paulla Fore if your symptoms worsen or do not improve the next 2 weeks.  Take care, Dr Jerline Pain

## 2018-01-07 NOTE — Progress Notes (Signed)
   Subjective:  Casey Guerra is a 52 y.o. male who presents today for same-day appointment with a chief complaint of foot pain.   HPI:  Foot Pain, Acute problem Started this morning.  Patient was walking when he no severe pain to the bottom of his right foot.  Denies any falls, trauma, or other obvious precipitating events.  Pain was so severe that he had to stop walking.  Pain subsided after rest.  Does not feel any pain when he is at rest.  Pain worsens when he begins to walk.  Pain will radiate to the top of the foot.  He has had episodes of plantar fasciitis in the past and pain feels similar.  No specific treatments tried.  No other obvious alleviating or aggravating factors.  ROS: Per HPI  PMH: He reports that he has never smoked. He has never used smokeless tobacco. He reports that he drinks alcohol. He reports that he has current or past drug history. Drug: Other-see comments.  Objective:  Physical Exam: BP 126/82 (BP Location: Right Arm, Patient Position: Sitting, Cuff Size: Normal)   Pulse 71   Temp 97.9 F (36.6 C) (Oral)   Ht 5\' 9"  (1.753 m)   Wt 181 lb 12.8 oz (82.5 kg)   SpO2 97%   BMI 26.85 kg/m   Gen: NAD, resting comfortably MSK: -Right foot: No deformities.  Tender palpation along plantar arch.  No midfoot tenderness.  Neurovascular intact distally.  Assessment/Plan:  Right foot pain Exam most consistent with plantar fasciitis.  Other possibilities include midfoot gout (less likely given lack of inflamed joint on exam) and fracture (less likely given absence of trauma).  He has severe pain with walking.  Given his severe degree of pain, will place patient and rigid sole shoe for the next several days.  We will also start a course of oral anti-inflammatories.  Discussed home exercise program and conservative measures.  Discussed reasons to return to care.  Follow-up as needed.  Consider referral to sports medicine if symptoms do not improve over the next couple  weeks.  Algis Greenhouse. Jerline Pain, MD 01/07/2018 3:07 PM

## 2018-01-27 ENCOUNTER — Ambulatory Visit: Payer: BLUE CROSS/BLUE SHIELD | Admitting: Sports Medicine

## 2018-01-27 ENCOUNTER — Encounter: Payer: Self-pay | Admitting: Sports Medicine

## 2018-01-27 VITALS — BP 110/70 | HR 72 | Ht 69.0 in | Wt 179.6 lb

## 2018-01-27 DIAGNOSIS — M25562 Pain in left knee: Secondary | ICD-10-CM | POA: Diagnosis not present

## 2018-01-27 DIAGNOSIS — G8929 Other chronic pain: Secondary | ICD-10-CM

## 2018-01-27 DIAGNOSIS — M25362 Other instability, left knee: Secondary | ICD-10-CM

## 2018-01-27 MED ORDER — DICLOFENAC SODIUM 2 % TD SOLN: 1.0000 "application " | Freq: Two times a day (BID) | TRANSDERMAL | 0 refills | Status: AC

## 2018-01-27 MED ORDER — DICLOFENAC SODIUM 2 % TD SOLN: 1.0000 "application " | Freq: Two times a day (BID) | TRANSDERMAL | 2 refills | Status: AC

## 2018-01-27 NOTE — Patient Instructions (Addendum)
We are ordering an MRI for you today.  The imaging office will be calling you to schedule your appointment after we obtain authorization from your insurance company.   Please be sure you have signed up for MyChart so that we can get your results to you.  We will be in touch with you as soon as we can.  Please know, it can take up to 3-4 business days for the radiologist and Dr. Paulla Fore to have time to review the results and determine the best appropriate action.  If there is something that appears to be surgical or needs a referral to other specialists we will let you know through [place] or telephone.  Otherwise we will plan to schedule a follow up appointment with Dr. Paulla Fore once we have the results.    [place] instructions for Duexis, Pennsaid and Vimovo:  Your prescription will be filled through a mail order pharmacy.  It is typically [place] but may vary depending on where you live.  You will receive a phone call from them which will typically come from a [phone] phone number.  You must speak directly to them to have this medication filled.  When the pharmacy calls, they will need your mailing address (for overnight shipment of the medication) andy they will need payment information if you have a copay (typically no more than $10). If you have not heard from them 2-3 days after your appointment with Dr. Paulla Fore, contact us at the office [phone]) or through [place] so we can reach back out to the pharmacy.   Pennsaid instructions: You have been given a sample/prescription for Pennsaid, a topical medication.     You are to apply this gel to your injured body part twice daily (morning and evening).   A little goes a long way so you can use about a pea-sized amount for each area.   Spread this small amount over the area into a thin film and let it dry.   Be sure that you do not rub the gel into your skin for more than 10 or 15 seconds otherwise it can irritate you skin.    Once  you apply the gel, please do not put any other lotion or clothing in contact with that area for 30 minutes to allow the gel to absorb into your skin.   Some people are sensitive to the medication and can develop a sunburn-like rash.  If you have only mild symptoms it is okay to continue to use the medication but if you have any breakdown of your skin you should discontinue its use and please let us know.   If you have been written a prescription for Pennsaid, you will receive a pump bottle of this topical gel through a mail order pharmacy.  The instructions on the bottle will say to apply two pumps twice a day which may be too much gel for your particular area so use the pea-sized amount as your guide.
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
## 2018-02-02 ENCOUNTER — Other Ambulatory Visit: Payer: BLUE CROSS/BLUE SHIELD

## 2018-02-06 ENCOUNTER — Ambulatory Visit
Admission: RE | Admit: 2018-02-06 | Discharge: 2018-02-06 | Disposition: A | Discharge status: Home / Self Care | Payer: BLUE CROSS/BLUE SHIELD | Source: Ambulatory Visit | Attending: Sports Medicine | Admitting: Sports Medicine

## 2018-02-06 DIAGNOSIS — G8929 Other chronic pain: Secondary | ICD-10-CM

## 2018-02-06 DIAGNOSIS — M25562 Pain in left knee: Principal | ICD-10-CM

## 2018-02-07 NOTE — Progress Notes (Signed)
My chart message as below: Please ensure follow-up as scheduled within next 2 to 3 days.  It does look like your PCL is partially torn as we suspected.  Does look like you have a small amount of irritation of the cartilage as well which is associated with the injury.  I would like to schedule a follow-up to go over these results with you in person does not appear to be surgical at this time.

## 2018-02-10 ENCOUNTER — Encounter: Payer: Self-pay | Admitting: Sports Medicine

## 2018-02-10 ENCOUNTER — Ambulatory Visit: Payer: BLUE CROSS/BLUE SHIELD | Admitting: Sports Medicine

## 2018-02-10 VITALS — BP 98/70 | HR 68 | Ht 69.0 in | Wt 181.6 lb

## 2018-02-10 DIAGNOSIS — S83522D Sprain of posterior cruciate ligament of left knee, subsequent encounter: Secondary | ICD-10-CM

## 2018-02-10 DIAGNOSIS — G8929 Other chronic pain: Secondary | ICD-10-CM | POA: Diagnosis not present

## 2018-02-10 DIAGNOSIS — M25362 Other instability, left knee: Secondary | ICD-10-CM | POA: Diagnosis not present

## 2018-02-10 DIAGNOSIS — M25562 Pain in left knee: Secondary | ICD-10-CM

## 2018-02-10 NOTE — Patient Instructions (Signed)
Please perform the exercise program that we have prepared for you and gone over in detail on a daily basis.  In addition to the handout you were provided you can access your program through: [email or web address]   Your unique program code is: [id number]  I recommend you obtained a compression sleeve to help with your joint problems. There are many options on the market however I recommend obtaining a knee Body Helix compression sleeve.  You can find information (including how to appropriate measure yourself for sizing) can be found at www.Body http://www.lambert.com/.  Many of these products are health savings account (HSA) eligible.   You can use the compression sleeve at any time throughout the day but is most important to use while being active as well as for 2 hours post-activity.   It is appropriate to ice following activity with the compression sleeve in place.
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
## 2018-02-14 ENCOUNTER — Encounter: Payer: Self-pay | Admitting: Sports Medicine

## 2018-02-14 NOTE — Progress Notes (Signed)
PROCEDURE NOTE: THERAPEUTIC EXERCISES (97110) 15 minutes spent for Therapeutic exercises as below and as referenced in the AVS.  This included exercises focusing on stretching, strengthening, with significant focus on eccentric aspects.   Proper technique shown and discussed handout in great detail with ATC.  All questions were discussed and answered.   Long term goals include an improvement in range of motion, strength, endurance as well as avoiding reinjury. Frequency of visits is one time as determined during today's  office visit. Frequency of exercises to be performed is as per handout.  EXERCISES REVIEWED:  VMO Strengthening  Hamstring strengthening, hip stability

## 2018-05-13 ENCOUNTER — Ambulatory Visit: Payer: BLUE CROSS/BLUE SHIELD | Admitting: Family Medicine
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
## 2018-11-05 ENCOUNTER — Telehealth: Payer: Self-pay | Admitting: Physical Therapy

## 2018-11-11 NOTE — Telephone Encounter (Signed)
Called pt and LM for him to return call to Dr. Thompson Caul office to schedule an appt.  Also informed pt that Dr. Paulla Fore is no longer working for [employer] so if he needs sports medicine care that patients are being advised that they can switch to Dr. Hulan Saas.

## 2019-05-13 ENCOUNTER — Other Ambulatory Visit: Payer: Self-pay

## 2019-05-13 DIAGNOSIS — Z20822 Contact with and (suspected) exposure to covid-19: Secondary | ICD-10-CM

## 2019-05-14 LAB — NOVEL CORONAVIRUS, NAA: SARS-CoV-2, NAA: NOT DETECTED

## 2019-10-13 ENCOUNTER — Encounter: Payer: BLUE CROSS/BLUE SHIELD | Admitting: Family Medicine

## 2019-10-13 DIAGNOSIS — Z0289 Encounter for other administrative examinations: Secondary | ICD-10-CM

## 2020-01-11 ENCOUNTER — Encounter: Payer: BC Managed Care – PPO | Admitting: Family Medicine

## 2020-04-26 ENCOUNTER — Encounter: Payer: Self-pay | Admitting: Dermatology

## 2020-04-26 ENCOUNTER — Ambulatory Visit: Payer: BC Managed Care – PPO | Admitting: Dermatology

## 2020-04-26 ENCOUNTER — Other Ambulatory Visit: Payer: Self-pay

## 2020-04-26 DIAGNOSIS — L738 Other specified follicular disorders: Secondary | ICD-10-CM | POA: Diagnosis not present

## 2020-04-26 DIAGNOSIS — Z1283 Encounter for screening for malignant neoplasm of skin: Secondary | ICD-10-CM | POA: Diagnosis not present

## 2020-04-26 DIAGNOSIS — D1800 Hemangioma unspecified site: Secondary | ICD-10-CM | POA: Diagnosis not present

## 2020-05-10 ENCOUNTER — Encounter: Payer: Self-pay | Admitting: Dermatology

## 2020-05-10 NOTE — Progress Notes (Signed)
   Follow-Up Visit   Subjective  Casey Guerra is a 54 y.o. male who presents for the following: Skin Problem (check spot on back per wife).  General skin check Location:  Duration:  Quality:  Associated Signs/Symptoms: Modifying Factors:  Severity:  Timing: Context:   Objective  Well appearing patient in no apparent distress; mood and affect are within normal limits.  A full examination was performed including scalp, head, eyes, ears, nose, lips, neck, chest, axillae, abdomen, back, buttocks, bilateral upper extremities, bilateral lower extremities, hands, feet, fingers, toes, fingernails, and toenails. All findings within normal limits unless otherwise noted below.   Assessment & Plan    Encounter for screening for malignant neoplasm of skin Mid Back  Yearly skin check  Hemangioma, unspecified site (2) Right Abdomen (side) - Upper; Right Upper Back  Okay to leave  Sebaceous hyperplasia of face (2) Mid Forehead  No need for removal     I, Lavonna Monarch, MD, have reviewed all documentation for this visit.  The documentation on 05/10/20 for the exam, diagnosis, procedures, and orders are all accurate and complete.

## 2024-01-06 ENCOUNTER — Other Ambulatory Visit: Admitting: Radiology

## 2024-01-06 ENCOUNTER — Emergency Department (HOSPITAL_COMMUNITY)
Admission: EM | Admit: 2024-01-06 | Discharge: 2024-01-06 | Disposition: A | Discharge status: Home / Self Care | Source: Ambulatory Visit | Attending: Emergency Medicine | Admitting: Emergency Medicine

## 2024-01-06 ENCOUNTER — Ambulatory Visit (INDEPENDENT_AMBULATORY_CARE_PROVIDER_SITE_OTHER): Admitting: Radiology

## 2024-01-06 ENCOUNTER — Other Ambulatory Visit: Payer: Self-pay

## 2024-01-06 ENCOUNTER — Emergency Department (HOSPITAL_COMMUNITY)

## 2024-01-06 ENCOUNTER — Ambulatory Visit
Admission: RE | Admit: 2024-01-06 | Discharge: 2024-01-06 | Disposition: A | Discharge status: Home / Self Care | Attending: Physician Assistant | Admitting: Physician Assistant

## 2024-01-06 ENCOUNTER — Encounter (HOSPITAL_COMMUNITY): Payer: Self-pay

## 2024-01-06 VITALS — BP 111/77 | HR 100 | Temp 98.2°F | Resp 20 | Ht 69.0 in | Wt 168.0 lb

## 2024-01-06 DIAGNOSIS — R0781 Pleurodynia: Secondary | ICD-10-CM

## 2024-01-06 DIAGNOSIS — J189 Pneumonia, unspecified organism: Secondary | ICD-10-CM

## 2024-01-06 DIAGNOSIS — R0602 Shortness of breath: Secondary | ICD-10-CM | POA: Diagnosis not present

## 2024-01-06 DIAGNOSIS — Z85828 Personal history of other malignant neoplasm of skin: Secondary | ICD-10-CM | POA: Diagnosis not present

## 2024-01-06 DIAGNOSIS — J90 Pleural effusion, not elsewhere classified: Secondary | ICD-10-CM | POA: Diagnosis not present

## 2024-01-06 DIAGNOSIS — R1012 Left upper quadrant pain: Secondary | ICD-10-CM | POA: Insufficient documentation

## 2024-01-06 DIAGNOSIS — J9811 Atelectasis: Secondary | ICD-10-CM

## 2024-01-06 DIAGNOSIS — J181 Lobar pneumonia, unspecified organism: Secondary | ICD-10-CM | POA: Insufficient documentation

## 2024-01-06 DIAGNOSIS — R0789 Other chest pain: Secondary | ICD-10-CM | POA: Diagnosis present

## 2024-01-06 LAB — CBC WITH DIFFERENTIAL/PLATELET
Abs Immature Granulocytes: 0.03 K/uL (ref 0.00–0.07)
Basophils Absolute: 0 K/uL (ref 0.0–0.1)
Basophils Relative: 0 %
Eosinophils Absolute: 0 K/uL (ref 0.0–0.5)
Eosinophils Relative: 1 %
HCT: 39.5 % (ref 39.0–52.0)
Hemoglobin: 12.3 g/dL — ABNORMAL LOW (ref 13.0–17.0)
Immature Granulocytes: 1 %
Lymphocytes Relative: 12 %
Lymphs Abs: 0.8 K/uL (ref 0.7–4.0)
MCH: 29.4 pg (ref 26.0–34.0)
MCHC: 31.1 g/dL (ref 30.0–36.0)
MCV: 94.5 fL (ref 80.0–100.0)
Monocytes Absolute: 0.6 K/uL (ref 0.1–1.0)
Monocytes Relative: 9 %
Neutro Abs: 5 K/uL (ref 1.7–7.7)
Neutrophils Relative %: 77 %
Platelets: 187 K/uL (ref 150–400)
RBC: 4.18 MIL/uL — ABNORMAL LOW (ref 4.22–5.81)
RDW: 12.8 % (ref 11.5–15.5)
WBC: 6.5 K/uL (ref 4.0–10.5)
nRBC: 0 % (ref 0.0–0.2)

## 2024-01-06 LAB — I-STAT CHEM 8, ED
BUN: 20 mg/dL (ref 6–20)
Calcium, Ion: 1.21 mmol/L (ref 1.15–1.40)
Chloride: 103 mmol/L (ref 98–111)
Creatinine, Ser: 0.8 mg/dL (ref 0.61–1.24)
Glucose, Bld: 92 mg/dL (ref 70–99)
HCT: 37 % — ABNORMAL LOW (ref 39.0–52.0)
Hemoglobin: 12.6 g/dL — ABNORMAL LOW (ref 13.0–17.0)
Potassium: 4.3 mmol/L (ref 3.5–5.1)
Sodium: 138 mmol/L (ref 135–145)
TCO2: 27 mmol/L (ref 22–32)

## 2024-01-06 LAB — LIPASE, BLOOD: Lipase: 30 U/L (ref 11–51)

## 2024-01-06 LAB — COMPREHENSIVE METABOLIC PANEL WITH GFR
ALT: 26 U/L (ref 0–44)
AST: 24 U/L (ref 15–41)
Albumin: 3.5 g/dL (ref 3.5–5.0)
Alkaline Phosphatase: 67 U/L (ref 38–126)
Anion gap: 8 (ref 5–15)
BUN: 19 mg/dL (ref 6–20)
CO2: 24 mmol/L (ref 22–32)
Calcium: 8.8 mg/dL — ABNORMAL LOW (ref 8.9–10.3)
Chloride: 106 mmol/L (ref 98–111)
Creatinine, Ser: 0.86 mg/dL (ref 0.61–1.24)
GFR, Estimated: >60 mL/min (ref >60)
Glucose, Bld: 93 mg/dL (ref 70–99)
Potassium: 4.5 mmol/L (ref 3.5–5.1)
Sodium: 138 mmol/L (ref 135–145)
Total Bilirubin: 0.9 mg/dL (ref 0.0–1.2)
Total Protein: 6.9 g/dL (ref 6.5–8.1)

## 2024-01-06 MED ORDER — AMOXICILLIN-POT CLAVULANATE 875-125 MG PO TABS
1.0000 | ORAL_TABLET | Freq: Two times a day (BID) | ORAL | 0 refills | Status: AC
Start: 2024-01-06 — End: ?

## 2024-01-06 MED ORDER — FENTANYL CITRATE PF 50 MCG/ML IJ SOSY
50.0000 ug | PREFILLED_SYRINGE | Freq: Once | INTRAMUSCULAR | Status: AC
  Administered 2024-01-06: 50 ug via INTRAVENOUS
  Filled 2024-01-06: qty 1

## 2024-01-06 MED ORDER — AZITHROMYCIN 250 MG PO TABS
500.0000 mg | ORAL_TABLET | Freq: Once | ORAL | Status: AC
  Administered 2024-01-06: 500 mg via ORAL
  Filled 2024-01-06: qty 2

## 2024-01-06 MED ORDER — SODIUM CHLORIDE 0.9 % IV BOLUS
500.0000 mL | Freq: Once | INTRAVENOUS | Status: AC
  Administered 2024-01-06: 500 mL via INTRAVENOUS

## 2024-01-06 MED ORDER — IOHEXOL 350 MG/ML SOLN
100.0000 mL | Freq: Once | INTRAVENOUS | Status: AC | PRN
  Administered 2024-01-06: 100 mL via INTRAVENOUS

## 2024-01-06 MED ORDER — AMOXICILLIN-POT CLAVULANATE 875-125 MG PO TABS
1.0000 | ORAL_TABLET | Freq: Once | ORAL | Status: AC
  Administered 2024-01-06: 1 via ORAL
  Filled 2024-01-06: qty 1

## 2024-01-06 MED ORDER — AZITHROMYCIN 250 MG PO TABS: 250.0000 mg | ORAL_TABLET | Freq: Every day | ORAL | 0 refills | Status: AC

## 2024-01-06 MED ORDER — ONDANSETRON HCL 4 MG/2ML IJ SOLN
4.0000 mg | Freq: Once | INTRAMUSCULAR | Status: AC
  Administered 2024-01-06: 4 mg via INTRAVENOUS
  Filled 2024-01-06: qty 2

## 2024-01-06 NOTE — Discharge Instructions (Addendum)
 Please go directly to the ED upon discharge from Urgent Care. Your imaging was notable for a pleural effusion and atelectasis which may require drainage and further evaluation.

## 2024-01-06 NOTE — ED Triage Notes (Addendum)
 Patient has had left sided rib cage pain for 1 week. Went to urgent care today and was told he has a left sided pleural effusion and needed it drained. Denies cough. Pain worsened yesterday. No cough. Feels short of breath when walking.

## 2024-01-06 NOTE — ED Notes (Signed)
 Patient is being discharged from the Urgent Care and sent to the Emergency Department via private vehicle . Per Rocky Mecum PA, patient is in need of higher level of care due to SOB, left rib pain, and pleural effusion noted on imaging. Patient is aware and verbalizes understanding of plan of care.   Vitals:   01/06/24 1329  BP: 111/77  Pulse: 100  Resp: 20  Temp: 98.2 F (36.8 C)  SpO2: 94%

## 2024-01-06 NOTE — ED Notes (Signed)
 Discharge instructions reviewed with patient. Patient questions answered and opportunity for education reviewed. Patient voices understanding of discharge instructions with no further questions. Patient ambulatory with steady gait to lobby.
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
## 2024-01-06 NOTE — ED Provider Notes (Signed)
 [facility]    CSN: [id number] Arrival date & time: 01/06/24  1320      History   Chief Complaint Chief Complaint  Patient presents with   Chest Pain    HPI Casey Guerra is a 58 y.o. male.   HPI Pt reports concerns for SOB and left rib pain that has been ongoing since yesterday  He states he did have some left sided rib pain since last weekend but yesterday it became more severe and now he is trying to take more shallow breaths to prevent further pain He reports when his symptoms first started he was having odd-tasting phlegm but denies significant coughing  He reports he can feel chest congestion  He denies fever or chills  He reports increased pain with coughing, laughing or movement but decreased pain with rest    Past Medical History:  Diagnosis Date   Chicken pox    Chronic low back pain    Leukopenia    Squamous cell carcinoma of skin 10/17/2015   in situ- left forearm (CX3FU)   Thrombocytopenia (HCC)     Patient Active Problem List   Diagnosis Date Noted   Tear of PCL (posterior cruciate ligament) of knee, left, subsequent encounter 02/10/2018   HLD (hyperlipidemia) 12/10/2017   Right knee pain 02/29/2016    Past Surgical History:  Procedure Laterality Date   Wart Removed From Tongue  1974       Home Medications    Prior to Admission medications   Medication Sig Start Date End Date Taking? Authorizing Provider  amphetamine-dextroamphetamine (ADDERALL XR) 20 MG 24 hr capsule Take 20 mg by mouth. 12/03/23  Yes [provider]  finasteride (PROPECIA) 1 MG tablet Take 1 mg by mouth. 10/30/23  Yes [provider]  hydrOXYzine (ATARAX) 25 MG tablet Take 25 mg by mouth daily. 11/16/22  Yes [provider]  amoxicillin -clavulanate (AUGMENTIN ) 875-125 MG tablet Take 1 tablet by mouth every 12 (twelve) hours. 01/06/24   Lenor Hollering, MD  azithromycin  (ZITHROMAX ) 250 MG tablet Take 1 tablet (250 mg total) by mouth daily.  Take one tablet PO once daily for 4 days 01/06/24   Lenor Hollering, MD  diclofenac  (VOLTAREN ) 75 MG EC tablet Take 1 tablet (75 mg total) by mouth 2 (two) times daily. Patient not taking: Reported on 02/10/2018 01/07/18   Kennyth Worth HERO, MD  Diclofenac  Sodium (PENNSAID ) 2 % SOLN Place 1 application onto the skin 2 (two) times daily. Patient not taking: Reported on 04/26/2020 01/27/18   Marquette Ozell BIRCH, DO    Family History Family History  Problem Relation Age of Onset   Arthritis Mother    Cancer Mother    Depression Mother    Hearing loss Mother    Hypertension Mother    Alcohol abuse Father    Cancer Father    Depression Brother    Drug abuse Brother    Learning disabilities Son     Social History Social History   Tobacco Use   Smoking status: Never   Smokeless tobacco: Never  Vaping Use   Vaping status: Never Used  Substance Use Topics   Alcohol use: Yes    Comment: occasionally   Drug use: Yes    Types: Marijuana    Comment: Marijuana     Allergies   Codeine   Review of Systems Review of Systems  Constitutional:  Negative for chills and fever.  Respiratory:  Positive for shortness of breath.   Cardiovascular:  Positive for chest pain.     Physical Exam Triage Vital Signs ED Triage Vitals  Encounter Vitals Group     BP 01/06/24 1329 111/77     Girls Systolic BP Percentile --      Girls Diastolic BP Percentile --      Boys Systolic BP Percentile --      Boys Diastolic BP Percentile --      Pulse Rate 01/06/24 1329 100     Resp 01/06/24 1329 20     Temp 01/06/24 1329 98.2 F (36.8 C)     Temp Source 01/06/24 1329 Oral     SpO2 01/06/24 1329 94 %     Weight 01/06/24 1329 168 lb (76.2 kg)     Height 01/06/24 1329 5' 9 (1.753 m)     Head Circumference --      Peak Flow --      Pain Score 01/06/24 1358 7     Pain Loc --      Pain Education --      Exclude from Growth Chart --    No data found.  Updated Vital Signs BP 111/77 (BP Location: Right  Arm)   Pulse 100   Temp 98.2 F (36.8 C) (Oral)   Resp 20   Ht 5' 9 (1.753 m)   Wt 168 lb (76.2 kg)   SpO2 94%   BMI 24.81 kg/m   Visual Acuity Right Eye Distance:   Left Eye Distance:   Bilateral Distance:    Right Eye Near:   Left Eye Near:    Bilateral Near:     Physical Exam Vitals reviewed.  Constitutional:      General: He is awake. He is not in acute distress.    Appearance: Normal appearance. He is well-developed and well-groomed. He is not ill-appearing or toxic-appearing.  HENT:     Head: Normocephalic and atraumatic.  Cardiovascular:     Rate and Rhythm: Normal rate and regular rhythm.     Heart sounds: Normal heart sounds. No murmur heard.    No friction rub. No gallop.  Pulmonary:     Effort: Pulmonary effort is normal. Tachypnea present.     Breath sounds: Decreased air movement present. Examination of the left-lower field reveals decreased breath sounds. Decreased breath sounds present. No wheezing, rhonchi or rales.  Musculoskeletal:     Cervical back: Normal range of motion.  Skin:    General: Skin is warm and dry.  Neurological:     Mental Status: He is alert.  Psychiatric:        Attention and Perception: Attention normal.        Mood and Affect: Mood normal.        Speech: Speech normal.        Behavior: Behavior normal. Behavior is cooperative.      UC Treatments / Results  Labs (all labs ordered are listed, but only abnormal results are displayed) Labs Reviewed - No data to display  EKG   Radiology CT Angio Chest PE W/Cm &/Or Wo Cm Result Date: 01/06/2024 CLINICAL DATA:  PE suspected. LEFT chest pain. LEFT rib pain. Additional history of LEFT upper quadrant pain. EXAM: CT CHEST WITH CONTRAST CT ABDOMEN AND PELVIS WITH AND WITHOUT CONTRAST TECHNIQUE: Multidetector CT imaging of the chest was performed during intravenous contrast administration. Multidetector CT imaging of the abdomen and pelvis was performed following the standard  protocol before and during bolus administration of intravenous contrast. RADIATION DOSE REDUCTION: This  exam was performed according to the departmental dose-optimization program which includes automated exposure control, adjustment of the mA and/or kV according to patient size and/or use of iterative reconstruction technique. CONTRAST:  OMNIPAQUE  IOHEXOL  350 MG/ML SOLN COMPARISON:  None Available. FINDINGS: CT CHEST FINDINGS Cardiovascular: No filling defects within the pulmonary arteries to suggest acute pulmonary embolism. Mediastinum/Nodes: No axillary or supraclavicular adenopathy. No mediastinal or hilar adenopathy. No pericardial fluid. Esophagus normal. Lungs/Pleura: Within the medial aspect of the LEFT lower lobe there is a low-density focus of consolidation posterior the aorta measuring 3.9 x 2.8 cm (image 175/series 5). There is small LEFT pleural effusion. There is passive atelectasis of the LEFT lower lobe inferior and medial to the oblong low-density consolidation. Musculoskeletal: No aggressive osseous lesion. CT ABDOMEN AND PELVIS FINDINGS Hepatobiliary: No focal hepatic lesion. No biliary ductal dilatation. Gallbladder is normal. Common bile duct is normal. Pancreas: Pancreas is normal. No ductal dilatation. No pancreatic inflammation. Spleen: Normal spleen Adrenals/urinary tract: Adrenal glands and kidneys are normal. The ureters and bladder normal. Stomach/Bowel: Stomach, small bowel, appendix, and cecum are normal. The colon and rectosigmoid colon are normal. Vascular/Lymphatic: Abdominal aorta is normal caliber. There is no retroperitoneal or periportal lymphadenopathy. No pelvic lymphadenopathy. Reproductive: Unremarkable Other: No free fluid. Musculoskeletal: No aggressive osseous lesion. IMPRESSION: CHEST: 1. No evidence acute pulmonary embolism. 2. Low-density consolidation in the medial LEFT lower lobe is favored pneumonia or pulmonic abscess. Neoplasm is less favored. Recommend  follow-up to resolution. 3. Small associated LEFT parapneumonic effusion PELVIS: 1. No acute findings in the abdomen pelvis. 2. Normal pancreas. Electronically Signed   By: Jackquline Boxer M.D.   On: 01/06/2024 18:00   CT ABDOMEN PELVIS W CONTRAST Result Date: 01/06/2024 CLINICAL DATA:  PE suspected. LEFT chest pain. LEFT rib pain. Additional history of LEFT upper quadrant pain. EXAM: CT CHEST WITH CONTRAST CT ABDOMEN AND PELVIS WITH AND WITHOUT CONTRAST TECHNIQUE: Multidetector CT imaging of the chest was performed during intravenous contrast administration. Multidetector CT imaging of the abdomen and pelvis was performed following the standard protocol before and during bolus administration of intravenous contrast. RADIATION DOSE REDUCTION: This exam was performed according to the departmental dose-optimization program which includes automated exposure control, adjustment of the mA and/or kV according to patient size and/or use of iterative reconstruction technique. CONTRAST:  OMNIPAQUE  IOHEXOL  350 MG/ML SOLN COMPARISON:  None Available. FINDINGS: CT CHEST FINDINGS Cardiovascular: No filling defects within the pulmonary arteries to suggest acute pulmonary embolism. Mediastinum/Nodes: No axillary or supraclavicular adenopathy. No mediastinal or hilar adenopathy. No pericardial fluid. Esophagus normal. Lungs/Pleura: Within the medial aspect of the LEFT lower lobe there is a low-density focus of consolidation posterior the aorta measuring 3.9 x 2.8 cm (image 175/series 5). There is small LEFT pleural effusion. There is passive atelectasis of the LEFT lower lobe inferior and medial to the oblong low-density consolidation. Musculoskeletal: No aggressive osseous lesion. CT ABDOMEN AND PELVIS FINDINGS Hepatobiliary: No focal hepatic lesion. No biliary ductal dilatation. Gallbladder is normal. Common bile duct is normal. Pancreas: Pancreas is normal. No ductal dilatation. No pancreatic inflammation. Spleen:  Normal spleen Adrenals/urinary tract: Adrenal glands and kidneys are normal. The ureters and bladder normal. Stomach/Bowel: Stomach, small bowel, appendix, and cecum are normal. The colon and rectosigmoid colon are normal. Vascular/Lymphatic: Abdominal aorta is normal caliber. There is no retroperitoneal or periportal lymphadenopathy. No pelvic lymphadenopathy. Reproductive: Unremarkable Other: No free fluid. Musculoskeletal: No aggressive osseous lesion. IMPRESSION: CHEST: 1. No evidence acute pulmonary embolism. 2. Low-density consolidation  in the medial LEFT lower lobe is favored pneumonia or pulmonic abscess. Neoplasm is less favored. Recommend follow-up to resolution. 3. Small associated LEFT parapneumonic effusion PELVIS: 1. No acute findings in the abdomen pelvis. 2. Normal pancreas. Electronically Signed   By: Jackquline Boxer M.D.   On: 01/06/2024 18:00   DG Ribs Unilateral W/Chest Left Result Date: 01/06/2024 CLINICAL DATA:  Rib pain EXAM: LEFT RIBS AND CHEST - 3+ VIEW COMPARISON:  None Available. FINDINGS: Frontal view of the chest and four views of left ribs. Frontal view of the chest demonstrates midline trachea. Normal heart size and mediastinal contours. Small left pleural effusion. No pneumothorax. Mild subsegmental atelectasis at the left lung base. Left rib radiographs demonstrate a marker projecting over the tenth posterolateral left rib. No underlying displaced fracture. IMPRESSION: No displaced rib fracture. Small left pleural effusion with left lower lobe subsegmental atelectasis. Electronically Signed   By: Rockey Kilts M.D.   On: 01/06/2024 14:05    Procedures Procedures (including critical care time)  Medications Ordered in UC Medications - No data to display  Initial Impression / Assessment and Plan / UC Course  I have reviewed the triage vital signs and the nursing notes.  Pertinent labs & imaging results that were available during my care of the patient were reviewed by me  and considered in my medical decision making (see chart for details).      Final Clinical Impressions(s) / UC Diagnoses   Final diagnoses:  Rib pain on left side  Pleural effusion on left  Atelectasis  SOB (shortness of breath)   Patient presents today with concerns for left-sided rib pain that has been ongoing since yesterday.  He reports difficulty with deep inspiration due to discomfort. he denies recent trauma or injuries to the area.  Physical exam is notable for decreased air movement in the left lower lobe along with tachypnea and decreased breath sounds.  Oxygen saturation is 94% with elevated respiration rate.  Chest x-ray was notable for left pleural effusion with left lower lobe subsegmental atelectasis.  Given the fact the patient is symptomatic with pleural effusion recommend follow-up in the emergency room for potential drainage.  Patient is amenable to this and declines EMS transportation stating that he will go via private vehicle.  Follow-up as needed for progressing or persistent symptoms following ED discharge    Discharge Instructions      Please go directly to the ED upon discharge from Urgent Care. Your imaging was notable for a pleural effusion and atelectasis which may require drainage and further evaluation.      ED Prescriptions   None    PDMP not reviewed this encounter.   Katlynn Naser, [place] BRAVO, PA-C 01/06/24 2050
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
## 2024-03-10 ENCOUNTER — Ambulatory Visit: Admitting: Pulmonary Disease
